# Patient Record
Sex: Female | Born: 1940 | Race: White | Hispanic: No
Health system: Midwestern US, Community
[De-identification: ages and names within clinical notes are randomized; demographics above are authoritative.]

## PROBLEM LIST (undated history)

## (undated) DIAGNOSIS — K900 Celiac disease: Secondary | ICD-10-CM

## (undated) DIAGNOSIS — N390 Urinary tract infection, site not specified: Secondary | ICD-10-CM

## (undated) DIAGNOSIS — L130 Dermatitis herpetiformis: Secondary | ICD-10-CM

## (undated) DIAGNOSIS — H3530 Unspecified macular degeneration: Secondary | ICD-10-CM

## (undated) HISTORY — DX: Celiac disease: K90.0

## (undated) HISTORY — PX: NEUROMA SURGERY: SHX722

## (undated) HISTORY — DX: Dermatitis herpetiformis: L13.0

## (undated) HISTORY — PX: DILATION AND CURETTAGE OF UTERUS: SHX78

## (undated) HISTORY — PX: VESICOVAGINAL FISTULA CLOSURE W/ TAH: SUR271

## (undated) HISTORY — PX: APPENDECTOMY: SHX54

## (undated) HISTORY — DX: Urinary tract infection, site not specified: N39.0

## (undated) HISTORY — DX: Unspecified macular degeneration: H35.30

---

## 2001-01-23 ENCOUNTER — Encounter: Payer: Self-pay | Admitting: Internal Medicine

## 2001-01-23 ENCOUNTER — Ambulatory Visit (HOSPITAL_COMMUNITY): Admission: RE | Admit: 2001-01-23 | Discharge: 2001-01-23 | Payer: Self-pay | Admitting: Internal Medicine

## 2001-01-23 ENCOUNTER — Other Ambulatory Visit: Admission: RE | Admit: 2001-01-23 | Discharge: 2001-01-23 | Payer: Self-pay | Admitting: Obstetrics and Gynecology

## 2002-01-17 ENCOUNTER — Ambulatory Visit (HOSPITAL_COMMUNITY): Admission: RE | Admit: 2002-01-17 | Discharge: 2002-01-17 | Payer: Self-pay | Admitting: Internal Medicine

## 2002-01-17 HISTORY — PX: ESOPHAGOGASTRODUODENOSCOPY: SHX1529

## 2002-02-25 ENCOUNTER — Encounter: Admission: RE | Admit: 2002-02-25 | Discharge: 2002-04-22 | Payer: Self-pay | Admitting: Internal Medicine

## 2002-05-07 ENCOUNTER — Ambulatory Visit (HOSPITAL_COMMUNITY): Admission: RE | Admit: 2002-05-07 | Discharge: 2002-05-07 | Payer: Self-pay | Admitting: Internal Medicine

## 2002-05-07 ENCOUNTER — Encounter: Payer: Self-pay | Admitting: Internal Medicine

## 2002-06-17 ENCOUNTER — Ambulatory Visit (HOSPITAL_COMMUNITY): Admission: RE | Admit: 2002-06-17 | Discharge: 2002-06-17 | Payer: Self-pay | Admitting: Internal Medicine

## 2002-06-17 HISTORY — PX: COLONOSCOPY: SHX174

## 2003-04-06 ENCOUNTER — Ambulatory Visit (HOSPITAL_COMMUNITY): Admission: RE | Admit: 2003-04-06 | Discharge: 2003-04-06 | Payer: Self-pay | Admitting: Family Medicine

## 2003-04-06 ENCOUNTER — Encounter: Payer: Self-pay | Admitting: Family Medicine

## 2003-04-15 ENCOUNTER — Ambulatory Visit (HOSPITAL_COMMUNITY): Admission: RE | Admit: 2003-04-15 | Discharge: 2003-04-15 | Payer: Self-pay | Admitting: Family Medicine

## 2003-04-15 ENCOUNTER — Encounter: Payer: Self-pay | Admitting: Family Medicine

## 2004-02-10 ENCOUNTER — Emergency Department (HOSPITAL_COMMUNITY): Admission: EM | Admit: 2004-02-10 | Discharge: 2004-02-10 | Payer: Self-pay

## 2004-05-04 ENCOUNTER — Ambulatory Visit (HOSPITAL_COMMUNITY): Admission: RE | Admit: 2004-05-04 | Discharge: 2004-05-04 | Payer: Self-pay | Admitting: Internal Medicine

## 2004-05-05 ENCOUNTER — Ambulatory Visit (HOSPITAL_COMMUNITY): Admission: RE | Admit: 2004-05-05 | Discharge: 2004-05-05 | Payer: Self-pay | Admitting: Internal Medicine

## 2004-05-06 ENCOUNTER — Ambulatory Visit (HOSPITAL_COMMUNITY): Admission: RE | Admit: 2004-05-06 | Discharge: 2004-05-06 | Payer: Self-pay | Admitting: Internal Medicine

## 2005-03-02 ENCOUNTER — Ambulatory Visit (HOSPITAL_COMMUNITY): Admission: RE | Admit: 2005-03-02 | Discharge: 2005-03-02 | Payer: Self-pay | Admitting: Family Medicine

## 2005-03-07 ENCOUNTER — Ambulatory Visit: Payer: Self-pay | Admitting: Internal Medicine

## 2005-04-05 ENCOUNTER — Ambulatory Visit (HOSPITAL_COMMUNITY): Admission: RE | Admit: 2005-04-05 | Discharge: 2005-04-05 | Payer: Self-pay | Admitting: Family Medicine

## 2005-04-26 ENCOUNTER — Ambulatory Visit (HOSPITAL_COMMUNITY): Admission: RE | Admit: 2005-04-26 | Discharge: 2005-04-26 | Payer: Self-pay | Admitting: General Surgery

## 2005-05-05 ENCOUNTER — Ambulatory Visit (HOSPITAL_COMMUNITY): Admission: RE | Admit: 2005-05-05 | Discharge: 2005-05-05 | Payer: Self-pay | Admitting: Internal Medicine

## 2005-05-15 ENCOUNTER — Ambulatory Visit (HOSPITAL_COMMUNITY): Admission: RE | Admit: 2005-05-15 | Discharge: 2005-05-15 | Payer: Self-pay | Admitting: Family Medicine

## 2006-04-10 ENCOUNTER — Ambulatory Visit: Payer: Self-pay | Admitting: Internal Medicine

## 2006-05-22 ENCOUNTER — Ambulatory Visit (HOSPITAL_COMMUNITY): Admission: RE | Admit: 2006-05-22 | Discharge: 2006-05-22 | Payer: Self-pay | Admitting: Internal Medicine

## 2007-05-10 ENCOUNTER — Ambulatory Visit: Payer: Self-pay | Admitting: Internal Medicine

## 2007-05-28 ENCOUNTER — Ambulatory Visit (HOSPITAL_COMMUNITY): Admission: RE | Admit: 2007-05-28 | Discharge: 2007-05-28 | Payer: Self-pay | Admitting: Internal Medicine

## 2007-05-30 ENCOUNTER — Ambulatory Visit (HOSPITAL_COMMUNITY): Admission: RE | Admit: 2007-05-30 | Discharge: 2007-05-30 | Payer: Self-pay | Admitting: Family Medicine

## 2007-11-06 ENCOUNTER — Ambulatory Visit (HOSPITAL_COMMUNITY): Admission: RE | Admit: 2007-11-06 | Discharge: 2007-11-06 | Payer: Self-pay | Admitting: Family Medicine

## 2008-04-08 ENCOUNTER — Ambulatory Visit: Payer: Self-pay | Admitting: Internal Medicine

## 2008-06-02 ENCOUNTER — Ambulatory Visit (HOSPITAL_COMMUNITY): Admission: RE | Admit: 2008-06-02 | Discharge: 2008-06-02 | Payer: Self-pay | Admitting: Internal Medicine

## 2009-04-30 DIAGNOSIS — K900 Celiac disease: Secondary | ICD-10-CM

## 2009-04-30 DIAGNOSIS — L130 Dermatitis herpetiformis: Secondary | ICD-10-CM

## 2009-04-30 DIAGNOSIS — Z8719 Personal history of other diseases of the digestive system: Secondary | ICD-10-CM

## 2009-05-03 ENCOUNTER — Ambulatory Visit: Payer: Self-pay | Admitting: Gastroenterology

## 2009-06-09 ENCOUNTER — Encounter: Payer: Self-pay | Admitting: Family Medicine

## 2009-06-09 ENCOUNTER — Ambulatory Visit (HOSPITAL_COMMUNITY): Admission: RE | Admit: 2009-06-09 | Discharge: 2009-06-09 | Payer: Self-pay | Admitting: Family Medicine

## 2009-09-23 ENCOUNTER — Encounter: Payer: Self-pay | Admitting: Internal Medicine

## 2010-05-12 ENCOUNTER — Encounter (INDEPENDENT_AMBULATORY_CARE_PROVIDER_SITE_OTHER): Payer: Self-pay | Admitting: *Deleted

## 2010-06-13 ENCOUNTER — Ambulatory Visit (HOSPITAL_COMMUNITY)
Admission: RE | Admit: 2010-06-13 | Discharge: 2010-06-13 | Payer: Self-pay | Source: Home / Self Care | Admitting: Internal Medicine

## 2010-07-27 ENCOUNTER — Ambulatory Visit
Admission: RE | Admit: 2010-07-27 | Discharge: 2010-07-27 | Payer: Self-pay | Source: Home / Self Care | Attending: Gastroenterology | Admitting: Gastroenterology

## 2010-08-09 NOTE — Letter (Signed)
Summary: BONE DENSITY TEST  BONE DENSITY TEST   Imported By: Diana Eves 09/23/2009 14:01:48  _____________________________________________________________________  External Attachment:    Type:   Image     Comment:   External Document

## 2010-08-09 NOTE — Letter (Signed)
Summary: Recall Office Visit  [facility]  [address]   [place], [place] [address]   Phone: [phone]  Fax: [phone]      May 12, 2010   Lauren Meyers [address] [place], [place]  [address] Aug 01, 1940   Dear Ms. Tippetts,   According to our records, it is time for you to schedule a follow-up office visit with [place].   At your convenience, please call [phone] to schedule an office visit. If you have any questions, concerns, or feel that this letter is in error, we would appreciate your call.   Sincerely,    Diana Eves  [facility] Ph: [phone]   Fax: [phone]

## 2010-08-11 NOTE — Assessment & Plan Note (Signed)
Summary: 49YR F/U HX OF CELIAC DISEASE/LAW   Visit Type:  Follow-up Visit Primary Care Provider:  [name]  CC:  F/U celiac disease.  History of Present Illness: Pt is here for her yearly follow-up. 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 She denies any complaints other than feeling somewhat bloated intermittently, occasionally somewhat constipated. Was wondering about metamucil. Not on probiotic. Next DEXA scan due in Dec 2012. Taking calcium with D twice daily. No rectal bleeding. No abdominal pain. Doing well.   Current Medications (verified): 1)  Fish Oil 1200 Mg Caps (Omega-3 Fatty Acids) .... Take 1 Tablet By Mouth Two Times A Day 2)  Calcium 500 Mg Tabs (Calcium Carbonate) .... Two Times A Day 3)  Centrum Silver  Tabs (Multiple Vitamins-Minerals) .... Once Daily 4)  Cranberry Pill .... Take 1 Tablet By Mouth Once A Day 5)  Calcium Chews .... Two Daily 6)  Ocuvite Preservision  Tabs (Multiple Vitamins-Minerals) .... Take 1 Tablet By Mouth Once A Day 7)  Aspir-Low 81 Mg Tbec (Aspirin) .... Take 1 Tablet By Mouth Once A Day  Allergies (verified): 1)  ! Levaquin 2)  ! Entex 3)  ! Cipro  Past History:  Past Medical History: Celiac disease, in remission Dermatitis herpetiformis  Family History: Mother:breast ca, deceased Father: deceased, leukemia daughter had breast ca No FH of Colon Cancer:  Social History: Retired: was Production designer, theatre/television/film at a bank Married X 51 years 1 daughter Patient has never smoked.  Alcohol Use - yes, wine occasionally  Smoking Status:  never  Review of Systems General:  Denies fever, chills, and anorexia. Eyes:  Denies blurring, irritation, and discharge. ENT:  Denies sore throat, hoarseness, and difficulty swallowing. CV:  Denies chest pains and syncope. Resp:  Denies dyspnea at rest and wheezing. GI:  Complains of gas/bloating; denies difficulty swallowing, pain on swallowing, nausea, change  in bowel habits, bloody BM's, and black BMs. GU:  Denies urinary burning and urinary frequency. MS:  Denies joint pain / LOM, joint swelling, and joint stiffness. Derm:  Denies rash, itching, and dry skin. Neuro:  Denies weakness and syncope. Psych:  Denies depression and anxiety. Endo:  Denies cold intolerance and heat intolerance.  Vital Signs:  Patient profile:   70 year old female Height:      63 inches Weight:      137 pounds BMI:     24.36 Temp:     98.7 degrees F oral Pulse rate:   72 / minute BP sitting:   142 / 84  (left arm) Cuff size:   regular  Vitals Entered By: Cloria Spring LPN (July 27, 2010 1:33 PM)  Physical Exam  General:  Well developed, well nourished, no acute distress. Head:  Normocephalic and atraumatic. Mouth:  No deformity or lesions, dentition normal. Lungs:  Clear throughout to auscultation. Heart:  Regular rate and rhythm; no murmurs, rubs,  or bruits. Abdomen:  +BS, soft, non-tender, non-distended, no HSM, no rebound or guarding.  Msk:  Symmetrical with no gross deformities. Normal posture. Pulses:  Normal pulses noted. Extremities:  No clubbing, cyanosis, edema or deformities noted. Neurologic:  Alert and  oriented x4;  grossly normal neurologically. Psych:  Alert and cooperative. Normal mood and affect.  Impression & Recommendations:  Problem # 1:  Hx of CELIAC DISEASE (ICD-4.10)  70 year old Caucasian female, very pleasant, who is here for yearly follow-up. In remission, denies any problems at this time. only concern is of  mild gas/bloating. Had been advised to take metamucil by other provider. Instructed to avoid, as it does contain gluten. Offered samples of probiotic, after ensuring these are gluten-free.   Probiotic samples (week of align, week of restora offered) next TCS in 2013 Next DEXA scan in Dec 2012 F/U in 1 year.   Orders: Est. 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   Orders: Est. Patient Level II (10932)  Appended Document: 15YR F/U HX OF CELIAC DISEASE/LAW 1 YR F/U OPV IS IN THE COMPUTER

## 2010-11-22 NOTE — Assessment & Plan Note (Signed)
Lauren Meyers, Lauren Meyers                    CHART#:  [id number]   DATE:  05/10/2007                       DOB:  Feb 16, 1941   History of dermatitis herpetiformis/celiac disease, diagnosed 2003 by  [place].   History of dermatitis herpetiformis for decades, on Dapsone for many  years until diagnosis of celiac disease was made.  Lauren Meyers has done  extremely well.  She is well-versed and up to date on celiac disease and  is adherent to a gluten-free diet.  She has gained five pounds since she  was last seen, one year ago.  She is not having any skin problems.  Bowel function is good.  If anything, she is constipated from time to  time.  She had negative colonoscopy in 2003.  She is not having any  abdominal pain, nausea or vomiting, odynophagia, dysphagia, or reflux  symptoms.   She does have macular degeneration and is on an eye supplement vitamin.  She also takes calcium and vitamin D.  She is not on a broad  multivitamin supplement.  She also takes fish oil.   Apparently she had labs and bone density study earlier this year, done  through [facility].   It is notable that her celiac antibody panel reverted back to normal in  2004.   She tells me her brother was just diagnosed with some type of  appendiceal carcinoma at the time of abdominal hernia repair and there  is a suspicious lesion on the liver.  Additional CT scans are planned.  (He also has had a negative colonoscopy.)  At this point, there is no  family history of colon cancer.   CURRENT MEDICATIONS:  See updated list.   ALLERGIES:  LEVAQUIN, ENTEX.   EXAM TODAY:  She looks just fine.  Weight 136, height 5 feet 3, temperature 97.7, BP 130/84, pulse 70.  SKIN:  Warm and dry.  CHEST:  Lungs are clear to auscultation.  CARDIAC EXAM:  Regular rate and rhythm without murmur, gallop or rub.  ABDOMEN:  Nondistended, positive bowel sounds, soft, nontender, without  appreciable mass or organomegaly.  EXTREMITIES:  No edema.   ASSESSMENT:  Celiac disease, in remission on a gluten-free diet.  Ms.  Meyers is doing very well.  Dermatitis herpetiformis is also apparently in  remission.   Her long-term outlook is good as long as she is adherent to a gluten-  free diet.  Her increased risk of small bowel lymphoma and  adenocarcinoma are now being mitigated by adherence to a gluten-free  diet.   RECOMMENDATIONS:  I told Ms. Passe, in addition to continuing a gluten-  free diet, her calcium and vitamin D supplement, she ought to consider  taking a broad, gluten-free multivitamin supplement daily.  At this  point, we will have her plan to see me back in one year.  Would also  like to retrieve labs and last bone density study from Dr. Sharyon Medicus  office.   I will also ask her to try and get as much specific information in  regards to her brother's tumor as can be obtained, so we can add that on  the chart when she returns.       Jonathon Bellows, M.D.  Electronically Signed     RMR/MEDQ  D:  05/10/2007  T:  05/10/2007  Job:  [id number]   cc:   Madelin Rear. [name], MD  Mertha Finders., M.D.

## 2010-11-22 NOTE — Assessment & Plan Note (Signed)
Lauren Meyers, Lauren Meyers                    CHART#:  [id number]   DATE:  04/08/2008                       DOB:  12/08/1940   History of celiac disease and dermatitis herpetiformis, last seen on  05/10/2007.  She has done very well on a gluten-free diet.  Her  dermatitis herpetiformis is pretty much in remission.  She is not having  any problems.  She is having no GI symptoms.  Denies abdominal bloating,  diarrhea, constipation, melena, or rectal bleeding.  No abdominal pain.  Weight is down 2 pounds since she was last seen previously.  Overall  doing extremely well.  She does have relative osteopenia on a bone  densitometer.  She has declined Fosamax.  No odynophagia, dysphagia,  early satiety, reflux symptoms, nausea, or vomiting.  She feels great.  It turns out her brother did not have appendiceal carcinoma.  He had a  perforated appendix and had a long recovery, but he is doing well at  this time.  She is well versed now on acquisition of gluten-free  products.  It is notable that she was recommended she try Fosamax  through Dr. Sharyon Medicus office but has declined and concerned about side  effects.  She is due for repeat bone density study in 1 year.   CURRENT MEDICATIONS:  See updated list.   ALLERGIES:  No known drug allergies.   ALLERGIES:  Levaquin and Entex.   PHYSICAL EXAMINATION:  GENERAL:  Today, she appears at baseline.  VITAL SIGNS:  Weight 134, height 5 feet 3 inches, temperature 97.7, BP  120/70, and pulse 80.  SKIN:  Warm and dry.  CHEST:  Lungs are clear to auscultation.  CARDIAC:  Regular rate and rhythm without murmur, gallop, or rub.  ABDOMEN:  Nondistended, positive bowel sounds, soft, nontender without  appreciable mass or organomegaly.   ASSESSMENT:  Celiac disease in remission on a gluten-free diet.  The  patient is highly intelligent, well versed, and motivated individual.  Her outlook is very good.  From a GI standpoint, she continues here on a  gluten-free diet.   She has relative osteopenia, not mentioned above.  She does take her 1500 mg of calcium and her vitamin D supplement daily.  She is resistant on taking an etidronate.  I told her that she can have  her bone densitometry recheck in 2 years, and it can give her further  insights whether this agent would be beneficial.  Not mentioned above,  her last colonoscopy was in 2003.  She was found to have some internal  hemorrhoids, tortuous, otherwise normal-appearing colon.  I will  recommend she come back in 2013 for routine screening.  We will see her  back in 1 year in followup.       Jonathon Bellows, M.D.  Electronically Signed     RMR/MEDQ  D:  04/08/2008  T:  04/09/2008  Job:  [id number]   cc:   Madelin Rear. [name], MD

## 2010-11-25 NOTE — Op Note (Signed)
[facility]  Patient:    [name], Lauren Meyers Visit Number: [id number] MRN: [id number]          Service Type: END Location: DAY Attending Physician:  Jonathon Bellows Dictated by:   Roetta Sessions, M.D. Proc. Date: 01/17/02 Admit Date:  01/17/2002 Discharge Date: 01/17/2002   CC:         Elfredia Nevins, M.D.   Operative Report  PROCEDURE:  Esophagogastroduodenoscopy with biopsy.  GASTROENTEROLOGIST:  Roetta Sessions, M.D.  INDICATIONS:  The patient is a 70 year old lady with dermatitis herpetiformis referred for frequent gastroesophageal reflux disease symptoms.  She has not been on Prevacid for a year now (or any other acid suppression therapy).  She is referred for esophageal dysphagia as well but really denies having any trouble with esophageal dysphagia now.  EGD is now being done to further evaluate her longstanding reflux symptoms.  During the procedure, will go ahead and take some small-bowel biopsies given her history of dermatitis herpetiformis that is associated with celiac disease.  This approach has been discussed with Ms. Naeve.  Potential risks, benefits, and alternatives have been reviewed and questions answered.  She is agreeable.  Please see my dictated H&P for more information.  PROCEDURE NOTE:  O2 saturation, blood pressure, and pulse for this patient were monitored throughout the entire procedure.  CONSCIOUS SEDATION:  Versed 1 mg IV, Demerol 25 mg IV.  Cetacaine spray for topical pharyngeal anesthesia.  INSTRUMENT:  Olympus video chip gastroscope.  FINDINGS:   Examination of the tubular esophagus revealed a skinny, approximately 2 mm Schatzkis ring at the EG junction.  The esophageal mucosa otherwise appeared normal.  The EG junction was easily traversed.  Please see photos.  STOMACH:  Gastric cavity was emptied and insufflated well with air.  Thorough examination of gastric mucosa including retroflexed view of the  proximal stomach and esophagogastric junction demonstrated only a small hiatal hernia. The pylorus was patent and easily traversed.  DUODENUM: Bulb and second portion appeared normal.  THERAPEUTIC/DIAGNOSTIC MANEUVERS PERFORMED:  Four biopsies of the second portion of the duodenum were taken for histologic study.  Subsequently the scope was pulled back into the esophagus, and the ring was disrupted with four quadrant bites using cold biopsy forceps.  The patient tolerated the procedure well and was reacted in endoscopy.  IMPRESSION: 1. Skinny Schatzkis ring status post disruption as described above. 2. The remainder of the esophageal mucosa appeared normal. 3. Small hiatal hernia. 4. The remainder of the gastric mucosa appeared normal. 5. Normal and duodenum through the second portion status post small-bowel    biopsy.  RECOMMENDATIONS: 1. Will draw celiac antibody panel today. 2. The patient should start acid suppression with Nexium 40 mg orally    daily before breakfast. 3. Antireflux measures emphasized. 4. Further recommendations to follow. Dictated by:   Roetta Sessions, M.D. Attending Physician:  Jonathon Bellows DD:  01/17/02 TD:  01/20/02 Job: [id number]

## 2010-11-25 NOTE — H&P (Signed)
NAME:  Lauren Meyers, Lauren Meyers                             ACCOUNT NO.:  [id number]   MEDICAL RECORD NO.:  [id number]                  PATIENT TYPE:   LOCATION:                                       FACILITY:   PHYSICIAN:  R. Roetta Sessions, M.D.              DATE OF BIRTH:  [birth year]   DATE OF ADMISSION:  05/08/2002  DATE OF DISCHARGE:                                HISTORY & PHYSICAL   CHIEF COMPLAINT:  Need for colorectal cancer screening, also recent rectal  bleeding.   HISTORY OF PRESENT ILLNESS:  The patient is a pleasant 70 year old lady  followed in this practice for celiac disease.  She has a history of  dermatitis herpetiformis and is followed by Dr. Nita Sells, dermatology.  She  takes Dapsone.  She was started on a gluten-free diet over the summer and  this has been associated with the resolution of bloating and abdominal pain  and intermittent diarrhea.  She feels she is doing very well she says with  Dr. Margo Aye tapering her Dapsone off.  She has a significant history of  gastroesophageal reflux disease.  These symptoms are well controlled now on  Nexium 40 mg orally daily.  She had a history of dysphagia and was found to  have a Schatzki ring which was dilated.  That symptom has resolved.  Celiac  antibodies were previously positive.  Her small bowel was biopsied which  revealed blunting of the villous architecture consistent with celiac  disease.  She has seen the dietitian on one occasion and is planning to go  back for a review of a gluten-free diet.   The patient has had some blood per rectum and has never had a lower GI tract  image.  She has complained to me about this today.  There is no family  history of colorectal neoplasia.   PAST MEDICAL HISTORY:  Dermatitis herpetiformis.   PAST SURGICAL HISTORY:  D&C, hysterectomy, appendectomy and derailment right  foot removed previously.   CURRENT MEDICATIONS:  1. Dapsone 75 mg half tablet daily.  2. Aspirin 81 mg daily.  3. Biaxin t.i.d.  4. Nexium 40 mg orally daily.   ALLERGIES:  LEVAQUIN and ENTEX facial swelling.   FAMILY HISTORY:  As above.   SOCIAL HISTORY:  The patient has been married for 44 years.  She has one  daughter.  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  No  tobacco.  Rarely consumes a glass of wine.   REVIEW OF SYSTEMS:  As above.   PHYSICAL EXAMINATION:  GENERAL: The patient was a pleasant 70 year old lady  resting comfortably.  VITAL SIGNS: Weight.126, BP 120/80, pulse 92.  CHEST: Clear to auscultation.  CARDIAC: Regular rate and rhythm, without murmur, gallop, or rub.  BREAST: Deferred.  ABDOMEN: Nondistended, positive bowel sounds, soft, nontender, without  appreciable mass or organomegaly.  RECTAL: Deferred for  time of colonoscopy.   IMPRESSION AND RECOMMENDATION:  The patient is a 70 year-old lady with  celiac disease whose symptoms are quiescent now on a gluten-free diet.  She  is seeing Dr. Margo Aye for her dermatitis herpetiformis.  She has had some  rectal bleeding although she denies constipation or diarrhea.  She really  needs to have her lower GI tract evaluated via colonoscopy.  I have  discussed this _______________ with the patient today.  The potential risks,  benefits, and alternatives have been reviewed and questions answered.  She  is agreeable.  Physically is at risk for conscious sedation.  ASA 1.  We  will plan to perform colonoscopy in the near future.  Would also like to  check a CBC to see if it is normal.  Further recommendations to follow.                                               Jonathon Bellows, M.D.    RMR/MEDQ  D:  05/08/2002  T:  05/08/2002  Job:  [id number]   cc:   Madelin Rear. [name], M.D.  [address]  [place]  [place] [address]  Fax: [phone]   Mertha Finders., M.D.  [id number] [place]  [place] [address]  Fax: [phone]

## 2010-11-25 NOTE — Op Note (Signed)
NAME:  JERRIS, KELTZ                             ACCOUNT NO.:  [id number]   MEDICAL RECORD NO.:  [id number]                   PATIENT TYPE:  AMB   LOCATION:  DAY                                  FACILITY:  [facility]   PHYSICIAN:  R. Roetta Sessions, M.D.              DATE OF BIRTH:  10/19/1940   DATE OF PROCEDURE:  DATE OF DISCHARGE:                                 OPERATIVE REPORT   PROCEDURE:  Diagnostic colonoscopy.   ENDOSCOPIST:  Gerrit Friends. Rourk, M.D.   INDICATIONS FOR PROCEDURE:  The patient is a 70 year old lady with celiac  disease.  She has had some intermittent rectal bleeding.  She has had no  other change in her bowel habits otherwise.  She is now undergoing  diagnostic colonoscopy.  This approach has been discussed with the patient  previously.  The potential risks, benefits, and alternatives have been  reviewed.  Questions answered.  She is agreeable.   DESCRIPTION OF PROCEDURE:  O2 saturation, blood pressure, pulse and  respirations were monitored throughout the entire procedure.   CONSCIOUS SEDATION:  Versed 3 mg IV, Demerol 75 mg IV in divided doses.   INSTRUMENT:  Olympus video chip adult colonoscope.   FINDINGS:  A digital rectal exam revealed no abnormalities.   ENDOSCOPIC FINDINGS:  The prep was good.   RECTUM:  Examination of the rectal mucosa including the retroflex view of  the anal verge revealed only internal hemorrhoids.   COLON:  The colonic mucosa was surveyed from the rectosigmoid junction  through the left transverse and right colon to the area of the appendiceal  orifice, ileocecal valve, and cecum.  Aside from the tortuous redundant  colon, the mucosa appeared normal to the cecum.  The terminal ileum was  elevated to 10 cm.  This segment of the GI tract also appeared normal.   From this level the scope was slowly withdrawn.  All previously mentioned  mucosal surfaces were again seen; and no other abnormalities were observed.  The patient tolerated  the procedure well and was reacted in endoscopy.   IMPRESSION:  1. Internal hemorrhoids otherwise normal rectum.  2. Tortuous, but otherwise normal appearing colonic mucosa.  Normal terminal     ileum.   DISCUSSION:  I suspect that the patient has bleed from hemorrhoids.    RECOMMENDATIONS:  1. Hemorrhoid literature provided to the patient.  2. A 10-day course of Anusol HC suppositories, 1 per rectum at bedtime.  3. Office visit with me in 6 months.                                               Jonathon Bellows, M.D.    RMR/MEDQ  D:  06/17/2002  T:  06/17/2002  Job:  [id number]  cc:   Mertha Finders., M.D.  [id number] [place]  [place] [address]  Fax: [phone]   Madelin Rear. [name], M.D.  [address]  [place]  [place] [address]  Fax: [phone]

## 2011-02-10 ENCOUNTER — Telehealth: Payer: Self-pay | Admitting: Emergency Medicine

## 2011-02-10 NOTE — Telephone Encounter (Signed)
Error.Lauren Meyers ° °

## 2011-04-21 ENCOUNTER — Encounter (INDEPENDENT_AMBULATORY_CARE_PROVIDER_SITE_OTHER): Payer: Medicare Other | Admitting: Ophthalmology

## 2011-04-21 DIAGNOSIS — H35379 Puckering of macula, unspecified eye: Secondary | ICD-10-CM

## 2011-04-21 DIAGNOSIS — H43819 Vitreous degeneration, unspecified eye: Secondary | ICD-10-CM

## 2011-04-21 DIAGNOSIS — H3530 Unspecified macular degeneration: Secondary | ICD-10-CM

## 2011-04-21 DIAGNOSIS — Q142 Congenital malformation of optic disc: Secondary | ICD-10-CM

## 2011-06-28 ENCOUNTER — Encounter: Payer: Self-pay | Admitting: Internal Medicine

## 2011-09-21 ENCOUNTER — Other Ambulatory Visit (HOSPITAL_COMMUNITY): Payer: Self-pay | Admitting: Internal Medicine

## 2011-09-21 DIAGNOSIS — Z139 Encounter for screening, unspecified: Secondary | ICD-10-CM

## 2011-09-28 ENCOUNTER — Ambulatory Visit (HOSPITAL_COMMUNITY)
Admission: RE | Admit: 2011-09-28 | Discharge: 2011-09-28 | Disposition: A | Discharge status: Home / Self Care | Payer: Medicare Other | Source: Ambulatory Visit | Attending: Internal Medicine | Admitting: Internal Medicine

## 2011-09-28 DIAGNOSIS — Z139 Encounter for screening, unspecified: Secondary | ICD-10-CM

## 2011-09-28 DIAGNOSIS — Z1231 Encounter for screening mammogram for malignant neoplasm of breast: Secondary | ICD-10-CM | POA: Insufficient documentation

## 2011-10-17 ENCOUNTER — Other Ambulatory Visit (HOSPITAL_COMMUNITY): Payer: Self-pay | Admitting: Internal Medicine

## 2011-10-17 DIAGNOSIS — Z01419 Encounter for gynecological examination (general) (routine) without abnormal findings: Secondary | ICD-10-CM

## 2011-10-17 DIAGNOSIS — Z139 Encounter for screening, unspecified: Secondary | ICD-10-CM

## 2011-10-20 ENCOUNTER — Ambulatory Visit (INDEPENDENT_AMBULATORY_CARE_PROVIDER_SITE_OTHER): Payer: Medicare Other | Admitting: Ophthalmology

## 2011-10-20 DIAGNOSIS — H3530 Unspecified macular degeneration: Secondary | ICD-10-CM

## 2011-10-20 DIAGNOSIS — H35379 Puckering of macula, unspecified eye: Secondary | ICD-10-CM

## 2011-10-20 DIAGNOSIS — H43819 Vitreous degeneration, unspecified eye: Secondary | ICD-10-CM

## 2011-10-24 ENCOUNTER — Ambulatory Visit (HOSPITAL_COMMUNITY)
Admission: RE | Admit: 2011-10-24 | Discharge: 2011-10-24 | Disposition: A | Discharge status: Home / Self Care | Payer: Medicare Other | Source: Ambulatory Visit | Attending: Internal Medicine | Admitting: Internal Medicine

## 2011-10-24 DIAGNOSIS — Z01419 Encounter for gynecological examination (general) (routine) without abnormal findings: Secondary | ICD-10-CM | POA: Insufficient documentation

## 2011-10-24 DIAGNOSIS — M949 Disorder of cartilage, unspecified: Secondary | ICD-10-CM | POA: Insufficient documentation

## 2011-10-24 DIAGNOSIS — Z139 Encounter for screening, unspecified: Secondary | ICD-10-CM

## 2011-10-24 DIAGNOSIS — Z780 Asymptomatic menopausal state: Secondary | ICD-10-CM | POA: Insufficient documentation

## 2011-10-24 DIAGNOSIS — M899 Disorder of bone, unspecified: Secondary | ICD-10-CM | POA: Insufficient documentation

## 2011-10-24 DIAGNOSIS — Z13820 Encounter for screening for osteoporosis: Secondary | ICD-10-CM | POA: Insufficient documentation

## 2011-10-24 DIAGNOSIS — Z90710 Acquired absence of both cervix and uterus: Secondary | ICD-10-CM | POA: Insufficient documentation

## 2011-11-01 ENCOUNTER — Telehealth: Payer: Self-pay

## 2011-11-01 NOTE — Telephone Encounter (Signed)
Called pt. Her husband is getting ready to have eye surgery x 2. He will have them 6 weeks apart. She is going to wait until he is better and she will call me. ( She will schedule OV prior to TCS because she had not followed up on appt due for her celiac. )

## 2012-02-22 ENCOUNTER — Encounter: Payer: Self-pay | Admitting: Internal Medicine

## 2012-03-07 ENCOUNTER — Encounter: Payer: Self-pay | Admitting: Internal Medicine

## 2012-03-12 ENCOUNTER — Encounter: Payer: Self-pay | Admitting: Gastroenterology

## 2012-03-12 ENCOUNTER — Ambulatory Visit: Payer: Medicare Other | Admitting: Urgent Care

## 2012-03-12 ENCOUNTER — Ambulatory Visit (INDEPENDENT_AMBULATORY_CARE_PROVIDER_SITE_OTHER): Payer: Medicare Other | Admitting: Gastroenterology

## 2012-03-12 VITALS — BP 138/86 | HR 82 | Temp 97.2°F | Ht 63.0 in | Wt 130.4 lb

## 2012-03-12 DIAGNOSIS — K900 Celiac disease: Secondary | ICD-10-CM

## 2012-03-12 DIAGNOSIS — Z1211 Encounter for screening for malignant neoplasm of colon: Secondary | ICD-10-CM

## 2012-03-12 MED ORDER — PEG-KCL-NACL-NASULF-NA ASC-C 100 G PO SOLR: 1.0000 | ORAL | Status: AC

## 2012-03-12 NOTE — Addendum Note (Signed)
Addended by: Jennings Books on: 03/12/2012 12:19 PM   Modules accepted: Orders

## 2012-03-12 NOTE — Progress Notes (Signed)
Primary Care Physician:  Cassell Smiles., MD  Primary Gastroenterologist:  Roetta Sessions, MD   Chief Complaint  Patient presents with  . Colonoscopy    HPI:  Lauren Meyers is a 71 y.o. female here for f/u Celiac disease and schedule 10 year f/u TCS. She has long h/o dermatitis herpetiformis. In 2003, she presented to our office with c/o GERD. At that time, she had SB bx c/w celiac disease. She has maintained strict gluten free diet.   She continues to do well. She states she eats very healthy, follows gluten free diet. Has some mild constipation at times. BM QOD. Does better if eating yogurt. No diarrhea. Cottage cheese, fruits/veggies help. Walking regularly. Has lost about five pounds.  Moving to [facility] [place] the week of September 23rd. Plans to establish care with new PCP/gastroenterologist once she gets settled.   States her Bone Density study this spring was okay. Denies heartburn, dysphagia, vomiting, anorexia.    Current Outpatient Prescriptions  Medication Sig Dispense Refill  . aspirin 81 MG tablet Take 81 mg by mouth daily.        . calcium carbonate (OS-CAL) 1250 MG chewable tablet Chew 1 tablet by mouth daily.        . Cranberry 1000 MG CAPS Take by mouth.        . fish oil-omega-3 fatty acids 1000 MG capsule Take 2 g by mouth daily.        . Multiple Vitamin (MULTIVITAMIN) capsule Take 1 capsule by mouth daily.        . Multiple Vitamins-Minerals (PRESERVISION/LUTEIN PO) Take by mouth.        Allergies as of 03/12/2012 - Review Complete 03/12/2012  Allergen Reaction Noted  . Ciprofloxacin    . Entex    . Levofloxacin      Past Medical History  Diagnosis Date  . Celiac disease     in remission  . Dermatitis herpetiformis   . Recurrent UTI   . Macular degeneration     Past Surgical History  Procedure Date  . Dilation and curettage of uterus   . Vesicovaginal fistula closure w/ tah     with icidenta; appendectomy  . Appendectomy   . Neuroma surgery    right foot  . Esophagogastroduodenoscopy 01/17/2002    Small hiatal hernia/Skinny Schatzkis ring status post disruption/ 5. Normal and duodenum through the second portion status post small-bowel bx (c/w celiac disease)  . Colonoscopy 06/17/2002      Internal hemorrhoids otherwise normal rectum/  Tortuous, but otherwise normal appearing colonic mucosa    Family History  Problem Relation Age of Onset  . Leukemia Father   . Cancer Mother     age 52, deceased, involved breast/lings    History   Social History  . Marital Status: Married    Spouse Name: N/A    Number of Children: 1  . Years of Education: N/A   Occupational History  .     Social History Main Topics  . Smoking status: Never Smoker   . Smokeless tobacco: Never Used  . Alcohol Use: Not on file  . Drug Use: Not on file  . Sexually Active: Not on file   Other Topics Concern  . Not on file   Social History Narrative  . No narrative on file      ROS:  General: Negative for anorexia, weight loss, fever, chills, fatigue, weakness. Eyes: Negative for vision changes.  ENT: Negative for hoarseness, difficulty swallowing ,  nasal congestion. CV: Negative for chest pain, angina, palpitations, dyspnea on exertion, peripheral edema.  Respiratory: Negative for dyspnea at rest, dyspnea on exertion, cough, sputum, wheezing.  GI: See history of present illness. GU:  Negative for dysuria, hematuria, urinary incontinence, urinary frequency, nocturnal urination.  MS: Negative for joint pain, low back pain.  Derm: Negative for rash or itching.  Neuro: Negative for weakness, abnormal sensation, seizure, frequent headaches, memory loss, confusion.  Psych: Negative for anxiety, depression, suicidal ideation, hallucinations.  Endo: Negative for unusual weight change.  Heme: Negative for bruising or bleeding. Allergy: Negative for rash or hives.    Physical Examination:  BP 138/86  Pulse 82  Temp 97.2 F (36.2 C) (Tympanic)   Ht 5\' 3"  (1.6 m)  Wt 130 lb 6.4 oz (59.149 kg)  BMI 23.10 kg/m2   General: Well-nourished, well-developed in no acute distress.  Head: Normocephalic, atraumatic.   Eyes: Conjunctiva pink, no icterus. Mouth: Oropharyngeal mucosa moist and pink , no lesions erythema or exudate. Neck: Supple without thyromegaly, masses, or lymphadenopathy.  Lungs: Clear to auscultation bilaterally.  Heart: Regular rate and rhythm, no murmurs rubs or gallops.  Abdomen: Bowel sounds are normal, nontender, nondistended, no hepatosplenomegaly or masses, no abdominal bruits or    hernia , no rebound or guarding.   Rectal: deferred to time of colonoscopy.  Extremities: No lower extremity edema. No clubbing or deformities.  Neuro: Alert and oriented x 4 , grossly normal neurologically.  Skin: Warm and dry, no rash or jaundice.   Psych: Alert and cooperative, normal mood and affect.

## 2012-03-12 NOTE — Patient Instructions (Addendum)
We have scheduled you for a colonoscopy. See separate instructions.

## 2012-03-12 NOTE — Assessment & Plan Note (Addendum)
Continue gluten free diet, MVI, calcium with vitamin D. Patient has been resistent to use of prescription medications for osteopenia, etc. Last bone density study done through PCP office, states it was unremarkable.   Patient plans to move in near future. Encouraged she establish care with local gastroenterologist.

## 2012-03-12 NOTE — Progress Notes (Signed)
Faxed to PCP

## 2012-03-12 NOTE — Assessment & Plan Note (Signed)
Due for screening colonoscopy.  I have discussed the risks, alternatives, benefits with regards to but not limited to the risk of reaction to medication, bleeding, infection, perforation and the patient is agreeable to proceed. Written consent to be obtained.  

## 2012-03-12 NOTE — Progress Notes (Signed)
Patient called and wanted to cancel her tcs.  She stated that she has a lot going on with [place] and [place].  I called Kim Faint and cx the procedure.

## 2012-03-13 NOTE — Progress Notes (Signed)
Noted  

## 2012-03-19 ENCOUNTER — Encounter: Payer: Self-pay | Admitting: *Deleted

## 2012-03-20 ENCOUNTER — Ambulatory Visit (INDEPENDENT_AMBULATORY_CARE_PROVIDER_SITE_OTHER): Payer: Medicare Other | Admitting: Ophthalmology

## 2012-03-20 DIAGNOSIS — H43819 Vitreous degeneration, unspecified eye: Secondary | ICD-10-CM

## 2012-03-20 DIAGNOSIS — H3530 Unspecified macular degeneration: Secondary | ICD-10-CM

## 2012-03-20 DIAGNOSIS — H35379 Puckering of macula, unspecified eye: Secondary | ICD-10-CM

## 2012-03-21 ENCOUNTER — Ambulatory Visit (HOSPITAL_COMMUNITY): Admission: RE | Admit: 2012-03-21 | Payer: Medicare Other | Source: Ambulatory Visit | Admitting: Internal Medicine

## 2012-03-21 ENCOUNTER — Encounter (HOSPITAL_COMMUNITY): Admission: RE | Payer: Self-pay | Source: Ambulatory Visit

## 2012-03-21 SURGERY — COLONOSCOPY
Anesthesia: Moderate Sedation

## 2012-04-22 ENCOUNTER — Ambulatory Visit (INDEPENDENT_AMBULATORY_CARE_PROVIDER_SITE_OTHER): Payer: Medicare Other | Admitting: Ophthalmology

## 2012-09-17 ENCOUNTER — Ambulatory Visit (INDEPENDENT_AMBULATORY_CARE_PROVIDER_SITE_OTHER): Payer: Medicare Other | Admitting: Ophthalmology

## 2014-08-26 ENCOUNTER — Ambulatory Visit: Admit: 2014-08-26 | Payer: MEDICARE | Attending: Neurology | Primary: Family Medicine

## 2014-08-26 DIAGNOSIS — G450 Vertebro-basilar artery syndrome: Secondary | ICD-10-CM

## 2014-08-26 NOTE — Progress Notes (Signed)
Name:  Mia Andrade    DOB:  09/08/1940    PCP:   Bernita BuffyKERRY WILLIAMS ELEY, MD MRN:   [id number]  Referring:  As above    Chief Complaint:   Chief Complaint   Patient presents with   ??? Neurologic Problem     gait problem and dizziness - one episode about 2 weeks ago; attributed to ZzzQuil     HISTORY OF PRESENT ILLNESS:     This is a 74 y.o. female who presents for evaluation of dizziness.    She describes that sometime at the end January/ early February, she woke up in the AM, started walking, and was veering off to the left, hand to hold onto things to get her stay upright.  Went to lay back down then got up later and still felt same way, unsteady gait.  No spinning/ vertigo (she had that in past and this was not the same).  Not light-headed, didn't feel like she was going to pass out.  She called daughter who came and checked her, no facial droop, no slurred speech, no extremity weakness/ numbness.  Went to see her PCP that day.  Got Rx for Meclizine, had CT head and Carotid Doppler.  She says CT Head and Carotid Dopplers were both normal, and the symptoms lessened/ went away within 2 days (used Meclizine one tab per day x 3 days).  No recurrence.     She wonders if the symptoms was a side effect of her using ZzQuil for insomnia, but she had been using that OTC medication every night for the prior 3 months (since her husband passed away) but didn't have these symptoms earlier.       Does not take any daily medications, only takes Vitamins.      Orthostatics VS were checked today:    Lying  122/ 82  HR 84  Sitting  140/ 90  HR 90  Standing 164/ 90  HR 99      Complete Review of Systems: reviewed on intake H&P; refer to media section; otherwise as noted in HPI     Allergies   Allergen Reactions   ??? Celestone [Betamethasone] Unknown (comments)   ??? Ciprofloxacin Unknown (comments)   ??? Levaquin [Levofloxacin] Unknown (comments)   ??? Lipitor [Atorvastatin] Unknown (comments)   ??? Nitrofurantoin Unknown (comments)    ??? Other Medication Unknown (comments)     Entax   ??? Sulfur Unknown (comments)     "sulfer drugs"       History reviewed. No pertinent past medical history.  Past Surgical History   Procedure Laterality Date   ??? Hx colonoscopy       summer 2015     No family history on file.  History     Social History   ??? Marital Status: WIDOWED     Spouse Name: N/A   ??? Number of Children: N/A   ??? Years of Education: N/A     Occupational History   ??? Not on file.     Social History Main Topics   ??? Smoking status: Never Smoker    ??? Smokeless tobacco: Never Used   ??? Alcohol Use: 1.2 oz/week     2 Standard drinks or equivalent per week   ??? Drug Use: No   ??? Sexual Activity: Not on file     Other Topics Concern   ??? Not on file     Social History Narrative   ??? No narrative on file  PHYSICAL EXAM  Blood pressure 140/90, pulse 100, resp. rate 16, height  (1.575 m), weight 57.153 kg (126 lb), SpO2 98 %.     General: Well-nourished, no acute distress.    Neck: supple  Lungs: clear to auscultation  CV: regular rhythm, regular rate.    Extremities: no edema     NEUROLOGIC EXAMINATION      Mental status: Alert, oriented to person, place, situation.    Language: normal fluency and comprehension; no dysarthria.       Cranial Nerve Examination  CN II:   Pupils equal, reactive to light.   Visual fields normal in all 4 quadrants bilaterally  Fundoscopic exam normal bilaterally    CN III, IV, VI: Extraocular movements intact.   CN V: No facial numbness.  CN VII: No facial asymmetry.   CN VIII: Hearing grossly intact.   CN IX, X: Palate, uvula midline, gag deferred.   CN XII: Tongue midline w/o deviation.   CN XI: Shoulder shrug and SCM full in strength.     Motor Exam  Bulk: normal, symmetric x 4 exts     Tone: normal x 4 exts  Strength: 5/5 in all extremities     Sensory Exam  Intact LT, pinprick x 4 exts     DTRs (symmetric): 1+ biceps, triceps, patellars   Plantar response neutral bilaterally     Cerebellar/ Extrapyramidal Exam:    No rest tremor  No postural or action tremor   No dystaxia on FNF or H-S    Gait: Normal    Romberg negative     -----------------------------  No outside clinic notes/ imaging reports available for review    ASSESSMENT AND PLAN    ICD-10-CM ICD-9-CM    1. History of unsteady gait Z87.898 V15.89 MRI BRAIN WO CONT   2. 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 significant stenosis  Will check MRI Brain w/o contrast to rule out posterior circulation stroke  Advised patient to start taking enteric coated ASA 81 mg/ day  Pt says cholesterol was good at most recent lab check but she's not sure when that was  Told her that in the setting of TIA/ Stroke, LDL needs to be < 70   Gave her lab slip to get Lipid Panel checked at Dr. Alden Benjamin office, if not done recenlty  If LDL > 70, recommend starting statin to reduce chance of future stroke    Will see back in 1 month        Thank you for allowing me to be a part of your patient's care.    Please call me at [phone] if you have any questions.     Sincerely,  Oris Drone, MD

## 2014-08-26 NOTE — Patient Instructions (Signed)
PRESCRIPTION REFILL POLICY    [facility]   Statement to Patients  October 08, 2012      In an effort to ensure the large volume of patient prescription refills is processed in the most efficient and expeditious manner, we are asking our patients to assist us by calling your Pharmacy for all prescription refills, this will include also your  Mail Order Pharmacy. The pharmacy will contact our office electronically to continue the refill process.    Please do not wait until the last minute to call your pharmacy. We need at least 48 hours (2days) to fill prescriptions. We also encourage you to call your pharmacy before going to pick up your prescription to make sure it is ready.     With regard to controlled substance prescription refill requests (narcotic refills) that need to be picked up at our office, we ask your cooperation by providing us with at least 72 hours (3days) notice that you will need a refill.    We will not refill narcotic prescription refill requests after 4:00pm on any weekday, Monday through Thursday, or after 2:00pm on Fridays, or on the weekends.      We encourage everyone to explore another way of getting your prescription refill request processed using MyChart, our patient web portal through our electronic medical record system. MyChart is an efficient and effective way to communicate your medication request directly to the office and  downloadable as an app on your smart phone . MyChart also features a review functionality that allows you to view your medication list as well as leave messages for your physician. Are you ready to get connected? If so please review the attatched instructions or speak to any of our staff to get you set up right away!    Thank you so much for your cooperation. Should you have any questions please contact our Practice Administrator.    The Physicians and Staff,  [facility]     Test Results     If we have ordered testing for you, we do not call patients with results and we do not give test results over the phone. We schedule follow up appointments so that your results can be discussed in person and any questions you have regarding them may be addressed. If something of concern is revealed on your test, we will call you for a sooner follow up appointment. Additionally, results may be found by using the My Chart feature and one of our patient service representatives at the front desk can give you instructions on how to access this feature of our electronic medical record system.       Denial of Coverage by Insurance Companies    If you have physican order for a test or a medication denied by your insurance company, this does not mean the test or medication is not appropriate for you as that is a medical decision, not a decision to be made by an insurance company representative or by an insurance company physician who has not interviewed and examined you. This is a decision to be made between you and your physician.     The denial of services is a contractual matter between you and your insurance company, not an issue between your physician and the insurance company. If your test or medication is denied, you can take the following steps to help resolve the issue:    1. File a complaint with the Bureau of Insurance regarding your insurance   company's denial of services ordered for you. You can do this either by calling them directly or by completing an on-line complaint form on the [employer] Commission website. This can be found at www.[place].gov    2. Also file a formal complaint with your insurance company and ask to have the name of the person denying the service so that you may explore a legal option should you be harmed by this denial of service. Again, the fact the insurance company will not pay for the service does not mean it  is not medically necessary and I would encourage you to follow through with the plan that was made with your physician    3. File a written complaint with your employer so your employer and benefit manager is aware of the poor coverage they are providing their employees. If you have medicare/medicaid, complain to your representative in the House and to your Senator.        Advance Directives: Care Instructions  Your Care Instructions  An advance directive is a legal way to state your wishes at the end of your life. It tells your family and your doctor what to do if you can no longer say what you want.  There are two main types of advance directives. You can change them any time that your wishes change.  ?? A living will tells your family and your doctor your wishes about life support and other treatment.  ?? A medical power of attorney lets you name a person to make treatment decisions for you when you can't speak for yourself. This person is called a health care agent.  If you do not have an advance directive, decisions about your medical care may be made by a doctor or a judge who doesn't know you.  It may help to think of an advance directive as a gift to the people who care for you. If you have one, they won't have to make tough decisions by themselves.  Follow-up care is a key part of your treatment and safety. Be sure to make and go to all appointments, and call your doctor if you are having problems. It's also a good idea to know your test results and keep a list of the medicines you take.  How can you care for yourself at home?  ?? Discuss your wishes with your loved ones and your doctor. This way, there are no surprises.  ?? Many states have a unique form. Or you might use a universal form that has been approved by many states. This kind of form can sometimes be completed and stored online. Your electronic copy will then be available wherever you have a connection to the Internet. In most cases, doctors  will respect your wishes even if you have a form from a different state.  ?? You don't need a lawyer to do an advance directive. But you may want to get legal advice.  ?? Think about these questions when you prepare an advance directive:  ?? Who do you want to make decisions about your medical care if you are not able to? Many people choose a family member, close friend, or doctor.  ?? Do you know enough about life support methods that might be used? If not, talk to your doctor so you understand.  ?? What are you most afraid of that might happen? You might be afraid of having pain, losing your independence, or being kept alive by machines.  ?? Where would you   prefer to die? Choices include your home, a hospital, or a nursing home.  ?? Would you like to have information about hospice care to support you and your family?  ?? Do you want to donate organs when you die?  ?? Do you want certain religious practices performed before you die? If so, put your wishes in the advance directive.  ?? Read your advance directive every year, and make changes as needed.  When should you call for help?  Be sure to contact your doctor if you have any questions.   Where can you learn more?   Go to http://www.healthwise.net/[facility]  Enter R264 in the search box to learn more about "Advance Directives: Care Instructions."   ?? 2006-2015 Healthwise, Inc[employer]. Care instructions adapted under license by [place] (which disclaims liability or warranty for this information). This care instruction is for use with your licensed healthcare professional. If you have questions about a medical condition or this instruction, always ask your healthcare professional. Healthwise, Incorporated disclaims any warranty or liability for your use of this information.  Content Version: 10.7.482551; Current as of: August 29, 2013

## 2014-08-26 NOTE — Progress Notes (Signed)
Advanced Directive information was provided.

## 2014-08-28 NOTE — Telephone Encounter (Signed)
Attempted to contact the pt.  Unsuccessful.  No answer; no VM.      LOV note was faxed to the pt's PCP, [facility], at [phone].

## 2014-08-28 NOTE — Telephone Encounter (Signed)
Pt calling stating [facility] does not do mri and mra with contrast and would like a new [facility] be sent [facility].

## 2014-09-01 NOTE — Telephone Encounter (Signed)
Pt calling about test and to scheduling for follow up visit

## 2014-09-02 NOTE — Telephone Encounter (Signed)
Called and spoke to patient she needed to get set up for mri and mra  Gave her [place] # for scheduling   Patient states understanding

## 2014-09-08 ENCOUNTER — Inpatient Hospital Stay: Admit: 2014-09-08 | Payer: MEDICARE | Attending: Neurology | Primary: Family Medicine

## 2014-09-08 DIAGNOSIS — Z87898 Personal history of other specified conditions: Secondary | ICD-10-CM

## 2014-09-14 NOTE — Telephone Encounter (Signed)
Pt calling stating she received a letter stating that lab test results have not been received. Please call to advise if received or not. Stated she will call pcp office and ask them to fax results to office.

## 2014-09-15 NOTE — Telephone Encounter (Signed)
The patient was informed that we received her lab results today at the [facility] office.  She was reminded of her appt on 10/07/14 at the [facility] office.  She expressed understanding.

## 2014-09-30 NOTE — Telephone Encounter (Signed)
Faxed last office notes to Dr. Charlie PitterEley (PCP) office as patient requested. Confirmation recieved.

## 2014-09-30 NOTE — Telephone Encounter (Signed)
Patient requesting that last office visit be faxed to PCP  [phone]

## 2014-10-07 ENCOUNTER — Ambulatory Visit: Admit: 2014-10-07 | Payer: MEDICARE | Attending: Neurology | Primary: Family Medicine

## 2014-10-07 DIAGNOSIS — G450 Vertebro-basilar artery syndrome: Secondary | ICD-10-CM

## 2014-10-07 MED ORDER — CLOPIDOGREL 75 MG TAB
75 mg | ORAL_TABLET | Freq: Every day | ORAL | Status: AC
Start: 2014-10-07 — End: ?

## 2014-10-07 NOTE — Progress Notes (Signed)
Interval HPI:   This is a 74 y.o. female who is following up for     Chief Complaint   Patient presents with   ??? Results     MRI, MRA, labs     Reviewed MRI Brain:   Mild to moderate chronic small vessel ischemic changes  No evidence of acute or subacute stroke    Reveiwed MRA Brain:   Right vertebral dominant, left vertebral hypoplastic/ patent  Multi-focal irregularity from proximal to mid-basilar artery  PCAs patent but with mild-moderate diffuse irregularity bilaterally  ICAs, ACAs, MCAs patent  No flow limiting intracranial stenosis  No intracranial aneurysm    ===============  Accompanied by her daughter today  Reports no recurrent gait unsteadiness or any facial droop, slurred speech, extremity weakness/ numbness  MRI Brain doesn't show any stroke as noted above  MRA Brain shows multi-focal irregularity from proximal to mid basilar artery, bringing vertebrobasilar insufficiency into question    Started taking ASA 81 mg/day at last visit.  Had fasting lipids done at PCPs office and were faxed to [place].  I believe I saw the results/ signed them but they were sent to scanner so not uploaded yet into media section.  Generally on very little medication as she's been trying to avoid them.      Brief ROS: as above or otherwise negative  There have been no significant changes in PMHx, PSHx, SHx except as noted above.     Allergies   Allergen Reactions   ??? Celestone [Betamethasone] Unknown (comments)   ??? Ciprofloxacin Unknown (comments)   ??? Levaquin [Levofloxacin] Unknown (comments)   ??? Lipitor [Atorvastatin] Unknown (comments)   ??? Nitrofurantoin Unknown (comments)   ??? Other Medication Unknown (comments)     Entax   ??? Sulfur Unknown (comments)     "sulfer drugs"         Physical Exam  Blood pressure 152/84, pulse 67, resp. rate 16, height  (1.575 m), weight 55.339 kg (122 lb), SpO2 97 %.    No acute distress  Neck: no stiffness  CV: regular heart rate  Lungs: clear  Skin: no rashes     Focused Neurological Exam     Mental status: Alert and oriented to person, place situation.    Language: normal fluency and comprehension; no dysarthria.      CNs:   Visual fields grossly normal  Extraocular movements intact, no nystagmus  Face appears symmetric and facial strength normal.    Hearing is intact to casual conversation.     Sensory: intact light touch in both arms  Motor: Normal bulk and strength in all 4 extremities.    Reflexes: not checked today  Gait: normal    Impression      ICD-10-CM ICD-9-CM    1. 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  Explained to her that the safest course of action would be to continue taking the anti-platelet medication, but that Plavix provides a little better stroke risk reduction than ASA.  After discussing common SEFx she was agreeable to stopping ASA and starting Plavix.  Don't have cholesterol results today clinic, waiting for re-fax, but d/w patient that if LDL cholesterol is > 70 (goal is < 70 in setting of TIA/ stroke) then she should start taking a statin.  She's somewhat resistant to the idea of starting a cholesterol lowering agent.  She prefers to exercise and diet to get her LDL to target goal.  I told her that if she couldn't get LDL to goal w/o med, then she should start the statin.      Otherwise, patient doing well, doesn't need regular neurology visits, welcome to return if needed.    Addendum: 10-07-14:   Received re-fax from PCP's office today  Cholesterol checked on on (08-28-14)  Total cholesterol 245, HDL 70 (H), LDL 147 (H)      Prefer patient to be on statin for both cholesterol lowering and stroke risk reduction  Will forward note to Dr. Charlie PitterEley/ PCP so she can discuss with patient.

## 2014-10-07 NOTE — Patient Instructions (Signed)
It's hard to tell if you had a mild episode of vertigo or if you had a mild TIA  I can't rule out TIA due to the ragged narrowing of the arteries in the back of your head/ neck    I suggest you take Plavix 75 mg/ day instead of the Aspirin  If your LDL cholesterol is above 70, I recommend you start taking a cholesterol medication, Lipitor (or similar) to get the cholesterol under 70.  If you really don't want to do that, then you have to exercise/ diet and get the LDL < 70.   It it can't get under that number, you should start the cholesterol medication.    Otherwise, you're doing well, don't need regular neurology visits

## 2014-10-13 NOTE — Telephone Encounter (Signed)
1. If she thinks its from the Plavix, she can stop that and start ASA 81 mg/ day  2. Please fax a copy of her clinic note, MRI Brain, MRA Brain report to her PCP

## 2014-10-13 NOTE — Telephone Encounter (Signed)
Spoke with pt and identified x 2.  The patient was informed and expressed understanding.  All questions were answered.     Faxed clinic note, MRI Brain, MRA Brain report to her PCP. Fax confirmation received.

## 2014-10-13 NOTE — Telephone Encounter (Signed)
Please advise pt is having cramping and when she urine so also has a soft stool

## 2014-10-13 NOTE — Telephone Encounter (Signed)
TC to the pt.  Every time she urinates she has a soft stool.  She has had cramping since today.  Since last week.  This started right after she started Plavix on Thursday.  She takes it after her morning meal, not on an empty stomach.    She thinks it might be from the Plavix?    Please advise?

## 2017-10-02 ENCOUNTER — Telehealth: Payer: Self-pay | Admitting: Internal Medicine

## 2017-10-02 NOTE — Telephone Encounter (Signed)
[phone] PATIENT HAS NOT BEEN SEEN HERE IN SEVERAL YEARS AND WANTS TO ASK A QUESTION ABOUT CELIAC

## 2017-10-03 NOTE — Telephone Encounter (Signed)
Pt was tested several years ago and was asked to stay away from gluten. Pt had blood work 1 week ago and was told it's ok for her to have gluten. Pt would like to know if you've had other pts grow out of gluten allergies/intollerence? Pt said she is afraid to go back to eating gluten and have her body react in a bad way. Explained to pt that she could continue to avoid if she's afraid of feeling bad.

## 2017-10-03 NOTE — Telephone Encounter (Signed)
Patient previously had positive celiac labs AND small bowel biopsy positive for celiac back in the early 2000s.   I would be very apprehensive of going back on gluten with well documented celiac disease.   BLOOD TEST CAN ACTUALLY BE NORMAL IF SHE IS ON A GLUTEN FREE DIET. WE ACTUALLY USE BLOOD TEST TO SHOW HOW COMPLIANT PATIENT'S ARE WITH THEIR GLUTEN FREE DIET.   IF NOT GLUTEN EXPOSURE, THE LAB TEST CAN/WILL BE NORMAL.   She has not bee seen by us in over 6 years, I believe she moved away. Is she interested in reestablishing care or is she currently seeing a GI. She should be monitored periodically.

## 2017-10-04 NOTE — Telephone Encounter (Signed)
LMOM, pt notfied. Pt can call back if wanted.
